# Patient Record
Sex: Female | Born: 1985 | Race: White | Hispanic: No | Marital: Married | Smoking: Never smoker
Health system: Southern US, Community
[De-identification: ages and names within clinical notes are randomized; demographics above are authoritative.]

## PROBLEM LIST (undated history)

## (undated) DIAGNOSIS — R011 Cardiac murmur, unspecified: Secondary | ICD-10-CM

## (undated) DIAGNOSIS — G8929 Other chronic pain: Secondary | ICD-10-CM

## (undated) DIAGNOSIS — R42 Dizziness and giddiness: Secondary | ICD-10-CM

## (undated) DIAGNOSIS — M549 Dorsalgia, unspecified: Secondary | ICD-10-CM

## (undated) HISTORY — PX: TUBAL LIGATION: SHX77

---

## 2011-02-24 ENCOUNTER — Emergency Department (HOSPITAL_BASED_OUTPATIENT_CLINIC_OR_DEPARTMENT_OTHER)
Admission: EM | Admit: 2011-02-24 | Discharge: 2011-02-25 | Disposition: A | Discharge status: Home / Self Care | Attending: Emergency Medicine | Admitting: Emergency Medicine

## 2011-02-24 ENCOUNTER — Encounter (HOSPITAL_BASED_OUTPATIENT_CLINIC_OR_DEPARTMENT_OTHER): Payer: Self-pay | Admitting: *Deleted

## 2011-02-24 DIAGNOSIS — R109 Unspecified abdominal pain: Secondary | ICD-10-CM | POA: Insufficient documentation

## 2011-02-24 DIAGNOSIS — O2690 Pregnancy related conditions, unspecified, unspecified trimester: Secondary | ICD-10-CM | POA: Insufficient documentation

## 2011-02-24 HISTORY — DX: Cardiac murmur, unspecified: R01.1

## 2011-02-24 LAB — URINALYSIS, ROUTINE W REFLEX MICROSCOPIC
Glucose, UA: 250 mg/dL — AB
Leukocytes, UA: NEGATIVE
Nitrite: NEGATIVE
Protein, ur: NEGATIVE mg/dL
Urobilinogen, UA: 1 mg/dL (ref 0.0–1.0)

## 2011-02-24 LAB — PREGNANCY, URINE: Preg Test, Ur: POSITIVE

## 2011-02-24 MED ORDER — ACETAMINOPHEN 325 MG PO TABS
650.0000 mg | ORAL_TABLET | Freq: Once | ORAL | Status: AC
  Administered 2011-02-24: 650 mg via ORAL
  Filled 2011-02-24: qty 2

## 2011-02-24 NOTE — ED Notes (Signed)
Dr Patrica Duel at bedside. Using bedside ultrasound to evaluate FHT. 150bpm and regular.

## 2011-02-24 NOTE — ED Notes (Signed)
Pt c/o abdominal cramping and low back pain since yesterday. Pt sts she is [redacted] weeks pregnant with 3rd pregnancy. Pt denies spotting/bleeding.

## 2011-02-24 NOTE — ED Notes (Signed)
Pt states she would like to return tomorrow for an OB US. Also requesting tylenol for abdominal cramping.

## 2011-02-24 NOTE — ED Notes (Signed)
Pt reports lower abdominal and back cramping, intermittent since this afternoon. Denies n/v/d. Denies vaginal bleeding or discharge. Pt has not had a OB/GYN visit since discovering her pregnancy. States she has now been accepted at [facility] and has her first visit next Monday. Pt has had some intermittent cramping throughout the pregnancy, but none this severe.

## 2011-02-24 NOTE — ED Provider Notes (Signed)
History     CSN: [id number]  Arrival date & time 02/24/11  2004   First MD Initiated Contact with Patient 02/24/11 2255      Chief Complaint  Patient presents with  . Abdominal Cramping   gravida 3, para 2 female. States she is [redacted] weeks pregnant with her third pregnancy. She's had some right lower abdominal cramping and back pain since yesterday. However, she denies any vaginal bleeding. She's had no dysuria, no fever, no swelling or edema. Denies any dizziness or syncope. She does have an appointment with her OB/GYN on Monday.  (Consider location/radiation/quality/duration/timing/severity/associated sxs/prior treatment) HPI  Past Medical History  Diagnosis Date  . Heart murmur     History reviewed. No pertinent past surgical history.  No family history on file.  History  Substance Use Topics  . Smoking status: Never Smoker   . Smokeless tobacco: Not on file  . Alcohol Use: No    OB History    Grav Para Term Preterm Abortions TAB SAB Ect Mult Living                  Review of Systems  All other systems reviewed and are negative.    Allergies  Penicillins  Home Medications   Current Outpatient Rx  Name Route Sig Dispense Refill  . PRENATAL 27-0.8 MG PO TABS Oral Take 1 tablet by mouth daily.      BP 124/70  Pulse 106  Temp(Src) 98.2 F (36.8 C) (Oral)  Resp 18  SpO2 100%  Physical Exam  Nursing note and vitals reviewed. Constitutional: She appears well-developed and well-nourished. No distress.  HENT:  Head: Normocephalic.  Eyes: Pupils are equal, round, and reactive to light.  Cardiovascular: Normal heart sounds.   Pulmonary/Chest: Breath sounds normal.  Abdominal: Soft. She exhibits no distension and no mass. There is no rebound and no guarding.       Abdomen is gravid. Appears to be appropriate for dates. Minimal tenderness in the right lower abdomen and right pelvic area. Bedside ultrasound documents below  Musculoskeletal: Normal range of  motion.  Neurological: She is alert.  Skin: Skin is warm and dry.    ED Course  Procedures (including critical care time)  Labs Reviewed  URINALYSIS, ROUTINE W REFLEX MICROSCOPIC - Abnormal; Notable for the following:    Glucose, UA 250 (*)    All other components within normal limits  PREGNANCY, URINE   No results found.   No diagnosis found.    MDM  Pt is seen and examined;  Initial history and physical completed.  Will follow.    Procedures bedside ultrasound done by myself. This shows a viable intrauterine fetus with good cardiac activity. Heart rate calculated at approximately 150. Good fetal movement. Formal ultrasound is deferred.       Ariyan Brisendine A. Patrica Duel, MD 02/25/11 7829

## 2011-02-25 ENCOUNTER — Other Ambulatory Visit (HOSPITAL_BASED_OUTPATIENT_CLINIC_OR_DEPARTMENT_OTHER): Payer: Self-pay | Admitting: Emergency Medicine

## 2011-02-25 ENCOUNTER — Ambulatory Visit (HOSPITAL_BASED_OUTPATIENT_CLINIC_OR_DEPARTMENT_OTHER)
Admission: RE | Admit: 2011-02-25 | Discharge: 2011-02-25 | Disposition: A | Discharge status: Home / Self Care | Source: Ambulatory Visit | Attending: Emergency Medicine | Admitting: Emergency Medicine

## 2011-02-25 DIAGNOSIS — R52 Pain, unspecified: Secondary | ICD-10-CM

## 2011-02-25 DIAGNOSIS — R109 Unspecified abdominal pain: Secondary | ICD-10-CM | POA: Insufficient documentation

## 2011-02-25 DIAGNOSIS — Z331 Pregnant state, incidental: Secondary | ICD-10-CM

## 2011-02-25 DIAGNOSIS — O99891 Other specified diseases and conditions complicating pregnancy: Secondary | ICD-10-CM | POA: Insufficient documentation

## 2011-02-25 NOTE — ED Notes (Signed)
Radiology at bedside to schedule outpt return ultrasound.

## 2011-08-23 ENCOUNTER — Emergency Department (HOSPITAL_BASED_OUTPATIENT_CLINIC_OR_DEPARTMENT_OTHER)

## 2011-08-23 ENCOUNTER — Encounter (HOSPITAL_BASED_OUTPATIENT_CLINIC_OR_DEPARTMENT_OTHER): Payer: Self-pay | Admitting: *Deleted

## 2011-08-23 ENCOUNTER — Emergency Department (HOSPITAL_BASED_OUTPATIENT_CLINIC_OR_DEPARTMENT_OTHER)
Admission: EM | Admit: 2011-08-23 | Discharge: 2011-08-23 | Disposition: A | Discharge status: Home / Self Care | Attending: Emergency Medicine | Admitting: Emergency Medicine

## 2011-08-23 DIAGNOSIS — R1032 Left lower quadrant pain: Secondary | ICD-10-CM | POA: Insufficient documentation

## 2011-08-23 DIAGNOSIS — R109 Unspecified abdominal pain: Secondary | ICD-10-CM

## 2011-08-23 LAB — CBC WITH DIFFERENTIAL/PLATELET
Basophils Absolute: 0 10*3/uL (ref 0.0–0.1)
Basophils Relative: 1 % (ref 0–1)
Eosinophils Relative: 19 % — ABNORMAL HIGH (ref 0–5)
HCT: 38.3 % (ref 36.0–46.0)
MCH: 29.5 pg (ref 26.0–34.0)
MCHC: 33.7 g/dL (ref 30.0–36.0)
MCV: 87.6 fL (ref 78.0–100.0)
Monocytes Absolute: 0.8 10*3/uL (ref 0.1–1.0)
Monocytes Relative: 9 % (ref 3–12)
RDW: 14.1 % (ref 11.5–15.5)

## 2011-08-23 LAB — URINALYSIS, ROUTINE W REFLEX MICROSCOPIC
Bilirubin Urine: NEGATIVE
Glucose, UA: NEGATIVE mg/dL
Hgb urine dipstick: NEGATIVE
Ketones, ur: NEGATIVE mg/dL
Protein, ur: NEGATIVE mg/dL

## 2011-08-23 LAB — URINE MICROSCOPIC-ADD ON

## 2011-08-23 LAB — BASIC METABOLIC PANEL
BUN: 11 mg/dL (ref 6–23)
Calcium: 8.7 mg/dL (ref 8.4–10.5)
Creatinine, Ser: 0.9 mg/dL (ref 0.50–1.10)
GFR calc Af Amer: >90 mL/min (ref >90)

## 2011-08-23 LAB — WET PREP, GENITAL
Trich, Wet Prep: NONE SEEN
Yeast Wet Prep HPF POC: NONE SEEN

## 2011-08-23 MED ORDER — ONDANSETRON HCL 4 MG/2ML IJ SOLN
4.0000 mg | Freq: Once | INTRAMUSCULAR | Status: AC
  Administered 2011-08-23: 4 mg via INTRAVENOUS
  Filled 2011-08-23: qty 2

## 2011-08-23 MED ORDER — ONDANSETRON 8 MG PO TBDP: 8.0000 mg | ORAL_TABLET | Freq: Three times a day (TID) | ORAL | Status: AC | PRN

## 2011-08-23 MED ORDER — HYDROMORPHONE HCL PF 1 MG/ML IJ SOLN
1.0000 mg | Freq: Once | INTRAMUSCULAR | Status: AC
  Administered 2011-08-23: 1 mg via INTRAVENOUS
  Filled 2011-08-23: qty 1

## 2011-08-23 MED ORDER — IOHEXOL 300 MG/ML  SOLN
100.0000 mL | Freq: Once | INTRAMUSCULAR | Status: AC | PRN
  Administered 2011-08-23: 100 mL via INTRAVENOUS

## 2011-08-23 MED ORDER — IOHEXOL 300 MG/ML  SOLN
40.0000 mL | Freq: Once | INTRAMUSCULAR | Status: AC | PRN
  Administered 2011-08-23: 40 mL via ORAL

## 2011-08-23 MED ORDER — HYDROMORPHONE HCL 2 MG PO TABS: 2.0000 mg | ORAL_TABLET | ORAL | Status: AC | PRN

## 2011-08-23 NOTE — ED Provider Notes (Signed)
History     CSN: [id number]  Arrival date & time 08/23/11  0154   First MD Initiated Contact with Patient 08/23/11 0235      Chief Complaint  Patient presents with  . Abdominal Pain    (Consider location/radiation/quality/duration/timing/severity/associated sxs/prior treatment) HPI This is a 26 year old white female 5 weeks postpartum. She developed the sudden onset of a sharp left suprapubic pain about 3 hours ago. The pain is severe, and unlike anything previously experienced. The pain is made her nauseated. She denies vomiting. She denies diarrhea. She denies vaginal bleeding or discharge. The pain is worse with palpation or movement.  Past Medical History  Diagnosis Date  . Heart murmur     History reviewed. No pertinent past surgical history.  No family history on file.  History  Substance Use Topics  . Smoking status: Never Smoker   . Smokeless tobacco: Not on file  . Alcohol Use: No    OB History    Grav Para Term Preterm Abortions TAB SAB Ect Mult Living                  Review of Systems  All other systems reviewed and are negative.    Allergies  Penicillins  Home Medications   Current Outpatient Rx  Name Route Sig Dispense Refill  . PRENATAL 27-0.8 MG PO TABS Oral Take 1 tablet by mouth daily.      BP 112/80  Pulse 65  Temp 97.7 F (36.5 C) (Oral)  Resp 18  SpO2 100%  Physical Exam General: Well-developed, well-nourished female in no acute distress; appearance consistent with age of record HENT: normocephalic, atraumatic Eyes: pupils equal round and reactive to light; extraocular muscles intact Neck: supple Heart: regular rate and rhythm; no murmurs, rubs or gallops Lungs: clear to auscultation bilaterally Abdomen: soft; nondistended; left lower quadrant tenderness; no masses or hepatosplenomegaly; bowel sounds present GU: No flank tenderness; normal external genitalia; physiologic appearing vaginal discharge; the vaginal bleeding; no  cervical motion tenderness; no adnexal tenderness; no adnexal mass Extremities: No deformity; full range of motion; pulses normal Neurologic: Awake, alert and oriented; motor function intact in all extremities and symmetric; no facial droop Skin: Warm and dry Psychiatric: Flat affect    ED Course  Procedures (including critical care time)     MDM   Nursing notes and vitals signs, including pulse oximetry, reviewed.  Summary of this visit's results, reviewed by myself:  Labs:  Results for orders placed during the hospital encounter of 08/23/11  PREGNANCY, URINE      Component Value Range   Preg Test, Ur NEGATIVE  NEGATIVE  URINALYSIS, ROUTINE W REFLEX MICROSCOPIC      Component Value Range   Color, Urine YELLOW  YELLOW   APPearance CLOUDY (*) CLEAR   Specific Gravity, Urine 1.024  1.005 - 1.030   pH 6.0  5.0 - 8.0   Glucose, UA NEGATIVE  NEGATIVE mg/dL   Hgb urine dipstick NEGATIVE  NEGATIVE   Bilirubin Urine NEGATIVE  NEGATIVE   Ketones, ur NEGATIVE  NEGATIVE mg/dL   Protein, ur NEGATIVE  NEGATIVE mg/dL   Urobilinogen, UA 1.0  0.0 - 1.0 mg/dL   Nitrite NEGATIVE  NEGATIVE   Leukocytes, UA MODERATE (*) NEGATIVE  CBC WITH DIFFERENTIAL      Component Value Range   WBC 8.7  4.0 - 10.5 K/uL   RBC 4.37  3.87 - 5.11 MIL/uL   Hemoglobin 12.9  12.0 - 15.0 g/dL   HCT 38.3  36.0 - 46.0 %   MCV 87.6  78.0 - 100.0 fL   MCH 29.5  26.0 - 34.0 pg   MCHC 33.7  30.0 - 36.0 g/dL   RDW 16.1  09.6 - 04.5 %   Platelets 200  150 - 400 K/uL   Neutrophils Relative 29 (*) 43 - 77 %   Neutro Abs 2.5  1.7 - 7.7 K/uL   Lymphocytes Relative 42  12 - 46 %   Lymphs Abs 3.7  0.7 - 4.0 K/uL   Monocytes Relative 9  3 - 12 %   Monocytes Absolute 0.8  0.1 - 1.0 K/uL   Eosinophils Relative 19 (*) 0 - 5 %   Eosinophils Absolute 1.7 (*) 0.0 - 0.7 K/uL   Basophils Relative 1  0 - 1 %   Basophils Absolute 0.0  0.0 - 0.1 K/uL  BASIC METABOLIC PANEL      Component Value Range   Sodium 141  135 - 145  mEq/L   Potassium 3.5  3.5 - 5.1 mEq/L   Chloride 106  96 - 112 mEq/L   CO2 26  19 - 32 mEq/L   Glucose, Bld 91  70 - 99 mg/dL   BUN 11  6 - 23 mg/dL   Creatinine, Ser 4.09  0.50 - 1.10 mg/dL   Calcium 8.7  8.4 - 81.1 mg/dL   GFR calc non Af Amer 89 (*) >90 mL/min   GFR calc Af Amer >90  >90 mL/min  URINE MICROSCOPIC-ADD ON      Component Value Range   Squamous Epithelial / LPF FEW (*) RARE   WBC, UA 3-6  <3 WBC/hpf   RBC / HPF 0-2  <3 RBC/hpf   Bacteria, UA MANY (*) RARE   Urine-Other MUCOUS PRESENT    WET PREP, GENITAL      Component Value Range   Yeast Wet Prep HPF POC NONE SEEN  NONE SEEN   Trich, Wet Prep NONE SEEN  NONE SEEN   Clue Cells Wet Prep HPF POC NONE SEEN  NONE SEEN   WBC, Wet Prep HPF POC MANY (*) NONE SEEN    Imaging Studies: Ct Abdomen Pelvis W Contrast  08/23/2011  *RADIOLOGY REPORT*  Clinical Data: Left lower quadrant cramping for 2 hours.  The patient liver to be 85 weeks ago.  CT ABDOMEN AND PELVIS WITH CONTRAST  Technique:  Multidetector CT imaging of the abdomen and pelvis was performed following the standard protocol during bolus administration of intravenous contrast.  Contrast: 40mL OMNIPAQUE IOHEXOL 300 MG/ML  SOLN, OMNIPAQUE IOHEXOL 300 MG/ML  SOLN  Comparison: None.  Findings: Linear atelectasis or fibrosis in the left lung base.  The liver, spleen, gallbladder, pancreas, adrenal glands, abdominal aorta, and retroperitoneal lymph nodes are unremarkable.  Small cyst in the right kidney.  No solid mass or hydronephrosis suggested in the kidneys.  The stomach and small bowel are not abnormally distended.  No colonic distension or wall thickening. No free air or free fluid in the abdomen.  Pelvis:  The uterus is mildly enlarged, consistent with postpartum state.  Ovaries are not abnormally enlarged.  No free pelvic fluid collections.  The appendix is normal.  No evidence of diverticulitis.  The bladder wall is mildly thickened but this may just be due to  incomplete distension.  Normal alignment of the lumbar vertebrae.  IMPRESSION: No acute process demonstrated in the abdomen or pelvis.  Uterine enlargement consistent with postpartum state.  Original Report Authenticated  By: Marlon Pel, M.D.   5:23 AM Pain and tenderness significantly improved. Patient advised of unremarkable CT scan and lab findings. She was advised to contact her OB/GYN today or return if worse. We'll provide a small number of analgesic tablets.         Hanley Seamen, MD 08/23/11 [phone]

## 2011-08-23 NOTE — ED Notes (Signed)
Patient c/o cramping LLQ. Started about 2 hours ago.

## 2011-08-23 NOTE — ED Notes (Signed)
Returned from CT.

## 2011-08-23 NOTE — ED Notes (Signed)
Patient transported to CT 

## 2011-08-23 NOTE — ED Notes (Signed)
MD at bedside. 

## 2011-08-24 LAB — URINE CULTURE
Colony Count: NO GROWTH
Culture: NO GROWTH

## 2011-08-24 LAB — GC/CHLAMYDIA PROBE AMP, GENITAL: Chlamydia, DNA Probe: NEGATIVE

## 2012-07-05 ENCOUNTER — Emergency Department (HOSPITAL_BASED_OUTPATIENT_CLINIC_OR_DEPARTMENT_OTHER)
Admission: EM | Admit: 2012-07-05 | Discharge: 2012-07-05 | Disposition: A | Discharge status: Home / Self Care | Attending: Emergency Medicine | Admitting: Emergency Medicine

## 2012-07-05 ENCOUNTER — Encounter (HOSPITAL_BASED_OUTPATIENT_CLINIC_OR_DEPARTMENT_OTHER): Payer: Self-pay | Admitting: Family Medicine

## 2012-07-05 DIAGNOSIS — Z880 Allergy status to penicillin: Secondary | ICD-10-CM | POA: Insufficient documentation

## 2012-07-05 DIAGNOSIS — G8929 Other chronic pain: Secondary | ICD-10-CM | POA: Insufficient documentation

## 2012-07-05 DIAGNOSIS — R011 Cardiac murmur, unspecified: Secondary | ICD-10-CM | POA: Insufficient documentation

## 2012-07-05 DIAGNOSIS — J069 Acute upper respiratory infection, unspecified: Secondary | ICD-10-CM | POA: Insufficient documentation

## 2012-07-05 DIAGNOSIS — Z79899 Other long term (current) drug therapy: Secondary | ICD-10-CM | POA: Insufficient documentation

## 2012-07-05 DIAGNOSIS — R42 Dizziness and giddiness: Secondary | ICD-10-CM | POA: Insufficient documentation

## 2012-07-05 DIAGNOSIS — M549 Dorsalgia, unspecified: Secondary | ICD-10-CM | POA: Insufficient documentation

## 2012-07-05 HISTORY — DX: Dorsalgia, unspecified: M54.9

## 2012-07-05 HISTORY — DX: Dizziness and giddiness: R42

## 2012-07-05 HISTORY — DX: Other chronic pain: G89.29

## 2012-07-05 MED ORDER — ONDANSETRON HCL 8 MG PO TABS: 8.0000 mg | ORAL_TABLET | Freq: Three times a day (TID) | ORAL | Status: DC | PRN

## 2012-07-05 NOTE — ED Notes (Signed)
Pt c/o bilateral ear ache and chills since 8pm last night. Pt reports h/o vertigo and took meds for same last night as well as tramadol for pain.

## 2012-07-05 NOTE — ED Provider Notes (Signed)
History     CSN: [id number]  Arrival date & time 07/05/12  1108   First MD Initiated Contact with Patient 07/05/12 1136      Chief Complaint  Patient presents with  . Otalgia    (Consider location/radiation/quality/duration/timing/severity/associated sxs/prior treatment) HPI Comments: Patient presents with a 24 hour history of not feeling well.  She reports bilateral ear ache, sore throat, congestion, feels dizzy, nauseated.  She has a history of vertigo in the past and has meclizine.  She took this and it did not help.  Patient is a 27 y.o. female presenting with ear pain. The history is provided by the patient.  Otalgia Location:  Bilateral Behind ear:  No abnormality Quality:  Pressure Severity:  Moderate Onset quality:  Sudden Duration:  2 days Timing:  Constant Progression:  Worsening Chronicity:  New Relieved by:  Nothing Worsened by:  Nothing tried Ineffective treatments:  None tried   Past Medical History  Diagnosis Date  . Heart murmur   . Vertigo   . Chronic back pain     History reviewed. No pertinent past surgical history.  No family history on file.  History  Substance Use Topics  . Smoking status: Never Smoker   . Smokeless tobacco: Not on file  . Alcohol Use: No    OB History   Grav Para Term Preterm Abortions TAB SAB Ect Mult Living                  Review of Systems  HENT: Positive for ear pain.   All other systems reviewed and are negative.    Allergies  Penicillins  Home Medications   Current Outpatient Rx  Name  Route  Sig  Dispense  Refill  . Prenatal Vit-Fe Fumarate-FA (MULTIVITAMIN-PRENATAL) 27-0.8 MG TABS   Oral   Take 1 tablet by mouth daily.           BP 125/86  Pulse 108  Temp(Src) 97.5 F (36.4 C) (Oral)  Resp 20  Ht 5\' 5"  (1.651 m)  Wt 140 lb (63.504 kg)  BMI 23.3 kg/m2  SpO2 99%  Physical Exam  Nursing note and vitals reviewed. Constitutional: She is oriented to person, place, and time. She appears  well-developed and well-nourished. No distress.  HENT:  Head: Normocephalic and atraumatic.  Mouth/Throat: Oropharynx is clear and moist.  The bilateral TM's are clear without redness or erythema.  Neck: Normal range of motion. Neck supple.  Cardiovascular: Normal rate and regular rhythm.  Exam reveals no gallop and no friction rub.   No murmur heard. Pulmonary/Chest: Effort normal and breath sounds normal. No respiratory distress. She has no wheezes.  Abdominal: Soft. Bowel sounds are normal. She exhibits no distension. There is no tenderness.  Musculoskeletal: Normal range of motion.  Neurological: She is alert and oriented to person, place, and time.  Skin: Skin is warm and dry. She is not diaphoretic.    ED Course  Procedures (including critical care time)  Labs Reviewed - No data to display No results found.   No diagnosis found.    MDM  Symptoms likely viral in nature.  Will treat symptoms, plenty of fluids, return prn.        Geoffery Lyons, MD 07/05/12 1147

## 2013-06-04 ENCOUNTER — Ambulatory Visit (INDEPENDENT_AMBULATORY_CARE_PROVIDER_SITE_OTHER): Admitting: Family Medicine

## 2013-06-04 VITALS — BP 126/80 | HR 71 | Temp 98.0°F | Resp 17 | Ht 64.5 in | Wt 139.0 lb

## 2013-06-04 DIAGNOSIS — Z0000 Encounter for general adult medical examination without abnormal findings: Secondary | ICD-10-CM

## 2013-06-04 NOTE — Progress Notes (Signed)
Physical examination: History: 89 sexual lady who is here for physical examination. She is less than 2 months postpartum. She has with her a form for the [employer] and Compan[employer] your patient's who are [place]. The questions pertaining to life-threatening conditions, chronic mental health issues, respiratory diseases, ADD, home medical equipment, technical assistance devices, and special needs with environmental and architectural considerations. These were all discussed and are noncontributory issues. She has no known physical concerns at this time  Past history: Gravida 3 para 3 Surgeries: None Major medical illnesses: None Hospitalizations: Childbirth only Drug allergies: Penicillins Regular medications: None   Family history: Both parents are living and well. Father about 5460 and mother about 5750. No major familial illnesses. She has 2 siblings are living and well  Social history: Patient is married. Her husband is Acup[employer]er military. [place] in [place] and are getting ready to move back to [place]. There are no some deadlines with regard to getting all the paperwork done for that. She has 3 children, including a 507-week-old. Does not drink smoke or use drugs.  Review of systems: Constitutional: Unremarkable HEENT negative Respiratory: Unremarkable Cardiac breast are: Unremarkable Gastrointestinal: Unremarkable Genitourinary: Unremarkable. She has not yet started her menses back. She is not breast-feeding. Exoskeletal: Unremarkable Neurologic: Unremarkable Dermatologic: Unremarkable Psychiatric: Unremarkable Endocrine: Unremarkable  Physical exam: Pleasant alert lady in no major distress. TMs normal. Eyes PERRLA. Fundi benign. Throat clear. Teeth good. Neck supple without nodes or thyromegaly. No carotid bruits. Chest clear to auscultation. Heart regular without murmurs gallops or arrhythmias. She does have a history of a intermittent murmur since  childhood. Abdomen soft without organomegaly mass or tenderness breasts and pelvic exam not done at this time she is just weeks postpartum. Extremities unremarkable. Pedal pulses good. Skin warm dry.  Assessment: Normal physical examination  Plan: Complete paperwork Labs not ordered since she had been with pregnancy

## 2013-06-04 NOTE — Patient Instructions (Signed)
Return as needed

## 2013-06-05 ENCOUNTER — Telehealth: Payer: Self-pay

## 2013-06-05 NOTE — Telephone Encounter (Signed)
Spoke with patient today. She thought her after visit summary was her medical record copy but it wasn't. Office notes from her physical with Dr. Alwyn RenHopper faxed to attn: Sissy HoffMarion Shelley at [phone] with confirmation.

## 2013-06-05 NOTE — Telephone Encounter (Signed)
PT WAS A LITTLE UPSET THAT SHE ONLY GOT PART OF HER CHART PRINTED OUT YESTERDAY,SHE NEEDS EVERYTHING THAT WAS DONE INCLUDING GENERAL  APPEARANCE,EYE TEST RESULTS,CARDIOVASCULAR  STATUS ETC,SHE NEEDED THIS FOR MILITARY PURPOSES AND NEEDS RIGHT AWAY.  SHE WOULD LIKE ALL THIS INFO FAXED TO [phone]   BEST PHONE FOR PT IS [phone]

## 2013-09-16 ENCOUNTER — Emergency Department (HOSPITAL_BASED_OUTPATIENT_CLINIC_OR_DEPARTMENT_OTHER)
Admission: EM | Admit: 2013-09-16 | Discharge: 2013-09-16 | Disposition: A | Discharge status: Home / Self Care | Attending: Emergency Medicine | Admitting: Emergency Medicine

## 2013-09-16 ENCOUNTER — Encounter (HOSPITAL_BASED_OUTPATIENT_CLINIC_OR_DEPARTMENT_OTHER): Payer: Self-pay | Admitting: Emergency Medicine

## 2013-09-16 ENCOUNTER — Emergency Department (HOSPITAL_BASED_OUTPATIENT_CLINIC_OR_DEPARTMENT_OTHER)

## 2013-09-16 DIAGNOSIS — Z880 Allergy status to penicillin: Secondary | ICD-10-CM | POA: Insufficient documentation

## 2013-09-16 DIAGNOSIS — S99929A Unspecified injury of unspecified foot, initial encounter: Secondary | ICD-10-CM

## 2013-09-16 DIAGNOSIS — S8990XA Unspecified injury of unspecified lower leg, initial encounter: Secondary | ICD-10-CM | POA: Insufficient documentation

## 2013-09-16 DIAGNOSIS — Z79899 Other long term (current) drug therapy: Secondary | ICD-10-CM | POA: Insufficient documentation

## 2013-09-16 DIAGNOSIS — S99919A Unspecified injury of unspecified ankle, initial encounter: Secondary | ICD-10-CM

## 2013-09-16 DIAGNOSIS — S93601A Unspecified sprain of right foot, initial encounter: Secondary | ICD-10-CM

## 2013-09-16 DIAGNOSIS — G8929 Other chronic pain: Secondary | ICD-10-CM | POA: Insufficient documentation

## 2013-09-16 DIAGNOSIS — Y9389 Activity, other specified: Secondary | ICD-10-CM | POA: Insufficient documentation

## 2013-09-16 DIAGNOSIS — S93609A Unspecified sprain of unspecified foot, initial encounter: Secondary | ICD-10-CM | POA: Insufficient documentation

## 2013-09-16 DIAGNOSIS — Y929 Unspecified place or not applicable: Secondary | ICD-10-CM | POA: Insufficient documentation

## 2013-09-16 DIAGNOSIS — X500XXA Overexertion from strenuous movement or load, initial encounter: Secondary | ICD-10-CM | POA: Insufficient documentation

## 2013-09-16 DIAGNOSIS — R011 Cardiac murmur, unspecified: Secondary | ICD-10-CM | POA: Insufficient documentation

## 2013-09-16 NOTE — Discharge Instructions (Signed)
Foot Sprain The muscles and cord like structures which attach muscle to bone (tendons) that surround the feet are made up of units. A foot sprain can occur at the weakest spot in any of these units. This condition is most often caused by injury to or overuse of the foot, as from playing contact sports, or aggravating a previous injury, or from poor conditioning, or obesity. SYMPTOMS  Pain with movement of the foot.  Tenderness and swelling at the injury site.  Loss of strength is present in moderate or severe sprains. THE THREE GRADES OR SEVERITY OF FOOT SPRAIN ARE:  Mild (Grade I): Slightly pulled muscle without tearing of muscle or tendon fibers or loss of strength.  Moderate (Grade II): Tearing of fibers in a muscle, tendon, or at the attachment to bone, with small decrease in strength.  Severe (Grade III): Rupture of the muscle-tendon-bone attachment, with separation of fibers. Severe sprain requires surgical repair. Often repeating (chronic) sprains are caused by overuse. Sudden (acute) sprains are caused by direct injury or over-use. DIAGNOSIS  Diagnosis of this condition is usually by your own observation. If problems continue, a caregiver may be required for further evaluation and treatment. X-rays may be required to make sure there are not breaks in the bones (fractures) present. Continued problems may require physical therapy for treatment. PREVENTION  Use strength and conditioning exercises appropriate for your sport.  Warm up properly prior to working out.  Use athletic shoes that are made for the sport you are participating in.  Allow adequate time for healing. Early return to activities makes repeat injury more likely, and can lead to an unstable arthritic foot that can result in prolonged disability. Mild sprains generally heal in 3 to 10 days, with moderate and severe sprains taking 2 to 10 weeks. Your caregiver can help you determine the proper time required for  healing. HOME CARE INSTRUCTIONS   Apply ice to the injury for 15-20 minutes, 03-04 times per day. Put the ice in a plastic bag and place a towel between the bag of ice and your skin.  An elastic wrap (like an Ace bandage) may be used to keep swelling down.  Keep foot above the level of the heart, or at least raised on a footstool, when swelling and pain are present.  Try to avoid use other than gentle range of motion while the foot is painful. Do not resume use until instructed by your caregiver. Then begin use gradually, not increasing use to the point of pain. If pain does develop, decrease use and continue the above measures, gradually increasing activities that do not cause discomfort, until you gradually achieve normal use.  Use crutches if and as instructed, and for the length of time instructed.  Keep injured foot and ankle wrapped between treatments.  Massage foot and ankle for comfort and to keep swelling down. Massage from the toes up towards the knee.  Only take over-the-counter or prescription medicines for pain, discomfort, or fever as directed by your caregiver. SEEK IMMEDIATE MEDICAL CARE IF:   Your pain and swelling increase, or pain is not controlled with medications.  You have loss of feeling in your foot or your foot turns cold or blue.  You develop new, unexplained symptoms, or an increase of the symptoms that brought you to your caregiver. MAKE SURE YOU:   Understand these instructions.  Will watch your condition.  Will get help right away if you are not doing well or get worse. Document Released:   07/17/2001 Document Revised: 04/19/2011 Document Reviewed: 09/14/2007 Exit[employer] Patient Information 2015 [employer]. This information is not intended to replace advice given to you by your health care provider. Make sure you discuss any questions you have with your health care provider.  
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
## 2013-09-16 NOTE — ED Notes (Signed)
States she stepped on her right foot wrong and now having foot pain.

## 2015-06-01 IMAGING — CR DG FOOT COMPLETE 3+V*R*
3 series · 3 of 3 positions shown · non-contrast
Comparison: None.

CLINICAL DATA: Twisted RIGHT foot.  Lateral pain.

EXAM:
RIGHT FOOT COMPLETE - 3+ VIEW

[t foot ap right]
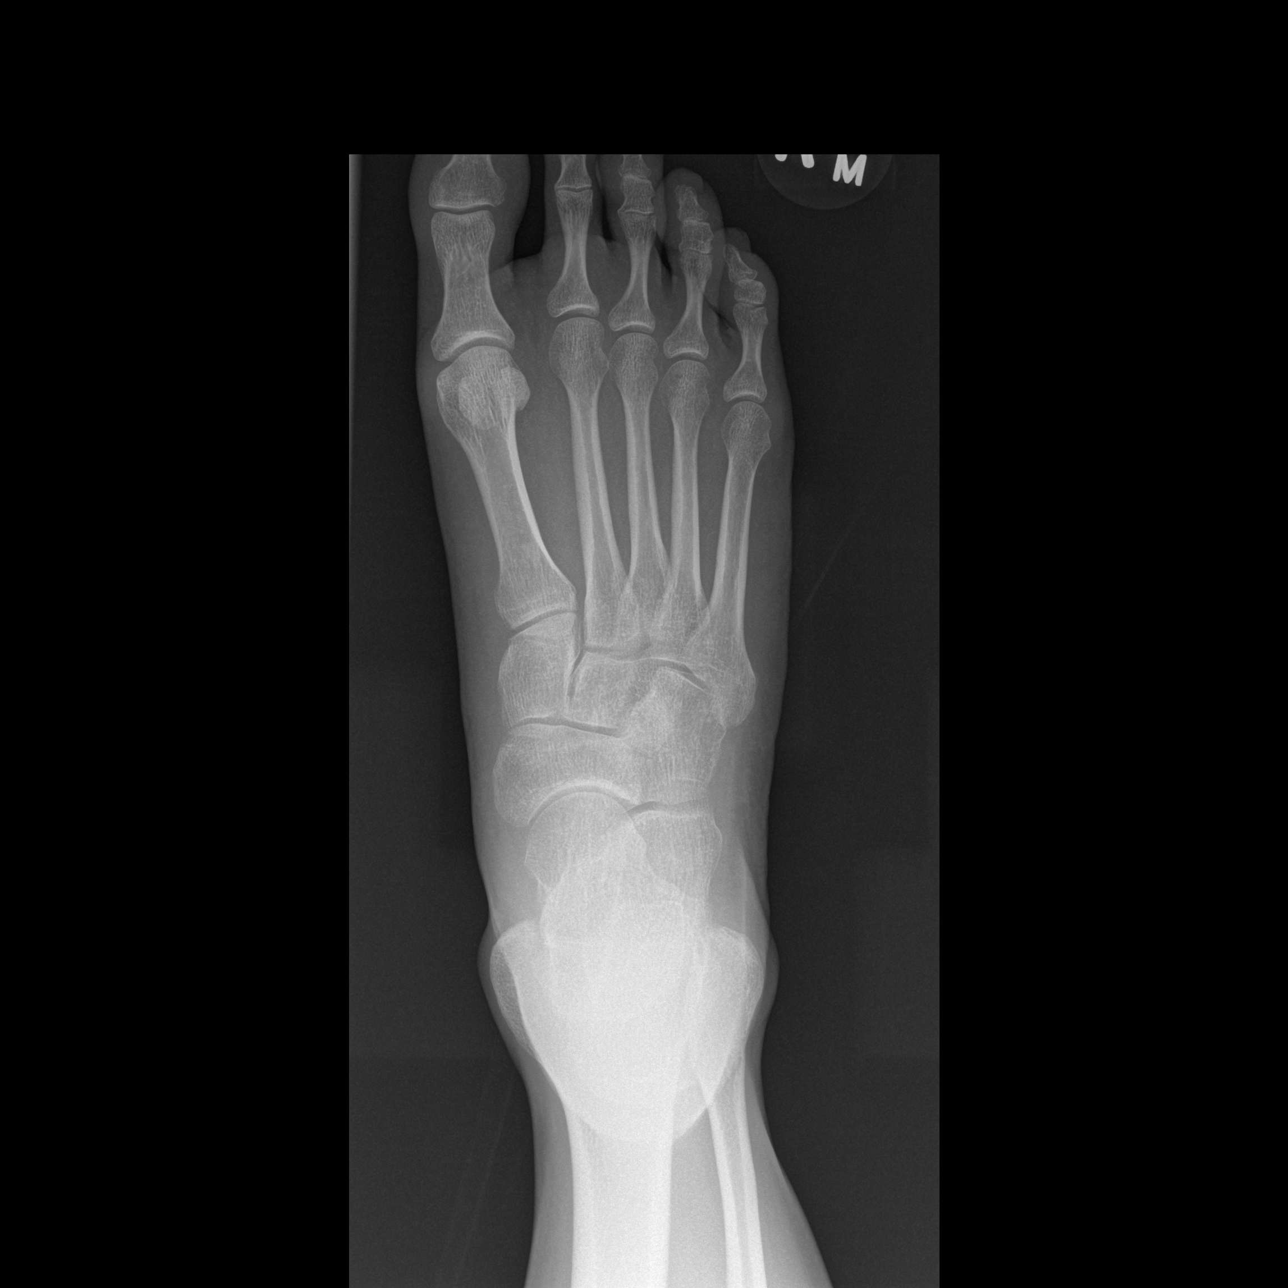

[t foot oblique right]
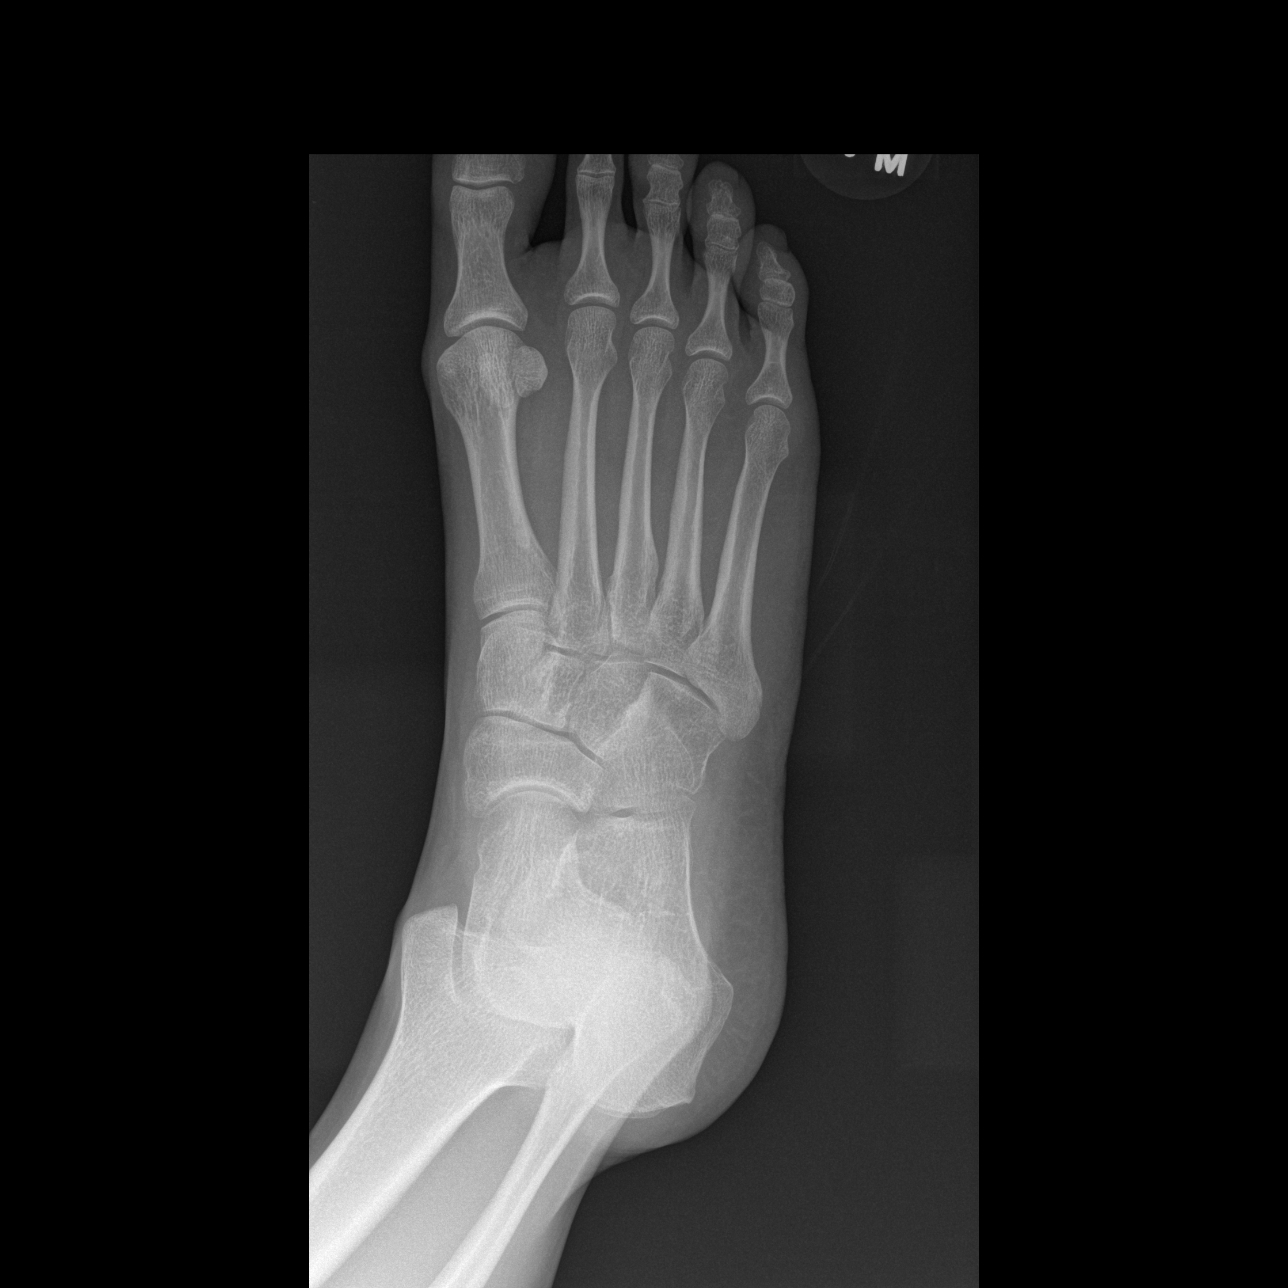

[t foot lat right]
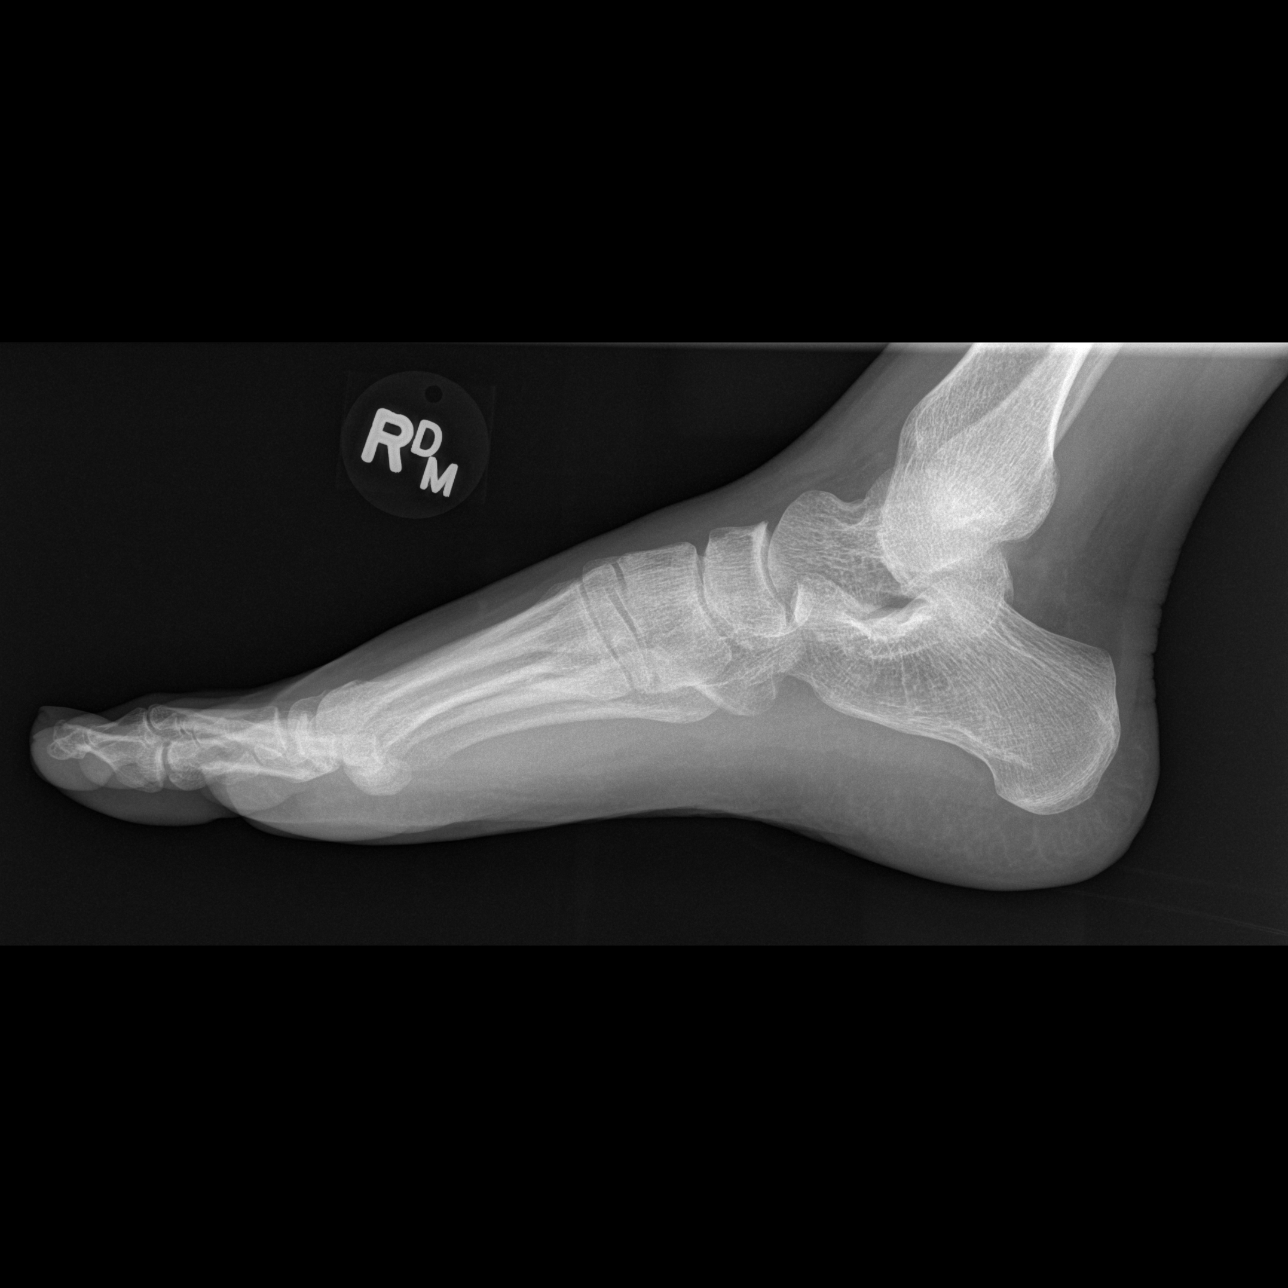

[3 of 3 positions shown; findings below may reference images not displayed]

FINDINGS: There is a healed fracture at the base of the fifth metatarsal. No
acute fracture is evident. Soft tissues are unremarkable.
IMPRESSION: No acute fracture.
